# Patient Record
Sex: Male | Born: 1995 | Race: Black or African American | Hispanic: No | Smoking: Never smoker
Health system: Southern US, Community
[De-identification: ages and names within clinical notes are randomized; demographics above are authoritative.]

## PROBLEM LIST (undated history)

## (undated) DIAGNOSIS — J45909 Unspecified asthma, uncomplicated: Secondary | ICD-10-CM

## (undated) HISTORY — DX: Unspecified asthma, uncomplicated: J45.909

---

## 2019-10-08 ENCOUNTER — Emergency Department (HOSPITAL_COMMUNITY)
Admission: EM | Admit: 2019-10-08 | Discharge: 2019-10-09 | Disposition: A | Discharge status: Home / Self Care | Payer: Self-pay | Attending: Emergency Medicine | Admitting: Emergency Medicine

## 2019-10-08 ENCOUNTER — Other Ambulatory Visit: Payer: Self-pay

## 2019-10-08 DIAGNOSIS — R112 Nausea with vomiting, unspecified: Secondary | ICD-10-CM | POA: Insufficient documentation

## 2019-10-08 DIAGNOSIS — M6281 Muscle weakness (generalized): Secondary | ICD-10-CM | POA: Insufficient documentation

## 2019-10-08 DIAGNOSIS — Z5321 Procedure and treatment not carried out due to patient leaving prior to being seen by health care provider: Secondary | ICD-10-CM | POA: Insufficient documentation

## 2019-10-09 ENCOUNTER — Encounter (HOSPITAL_COMMUNITY): Payer: Self-pay | Admitting: Emergency Medicine

## 2019-10-09 ENCOUNTER — Other Ambulatory Visit: Payer: Self-pay

## 2019-10-09 ENCOUNTER — Emergency Department (HOSPITAL_COMMUNITY): Payer: Self-pay

## 2019-10-09 MED ORDER — ALBUTEROL SULFATE HFA 108 (90 BASE) MCG/ACT IN AERS
2.0000 | INHALATION_SPRAY | Freq: Once | RESPIRATORY_TRACT | Status: AC
  Administered 2019-10-09: 2 via RESPIRATORY_TRACT
  Filled 2019-10-09: qty 6.7

## 2019-10-09 NOTE — ED Notes (Signed)
Pt called for VS x3. No answer

## 2019-10-09 NOTE — ED Triage Notes (Signed)
Pt to ED with c/o nausea and vomiting and generalized weakness  Onset 3 days ago   St's unable to keep anything down.  Pt also st's he has asthma and is out of his inhaler.

## 2021-07-23 IMAGING — CR DG CHEST 2V
2 series · 2 of 2 positions shown · non-contrast
Comparison: None.

CLINICAL DATA: 24-year-old male with asthma.

EXAM:
CHEST - 2 VIEW

[chest pa]
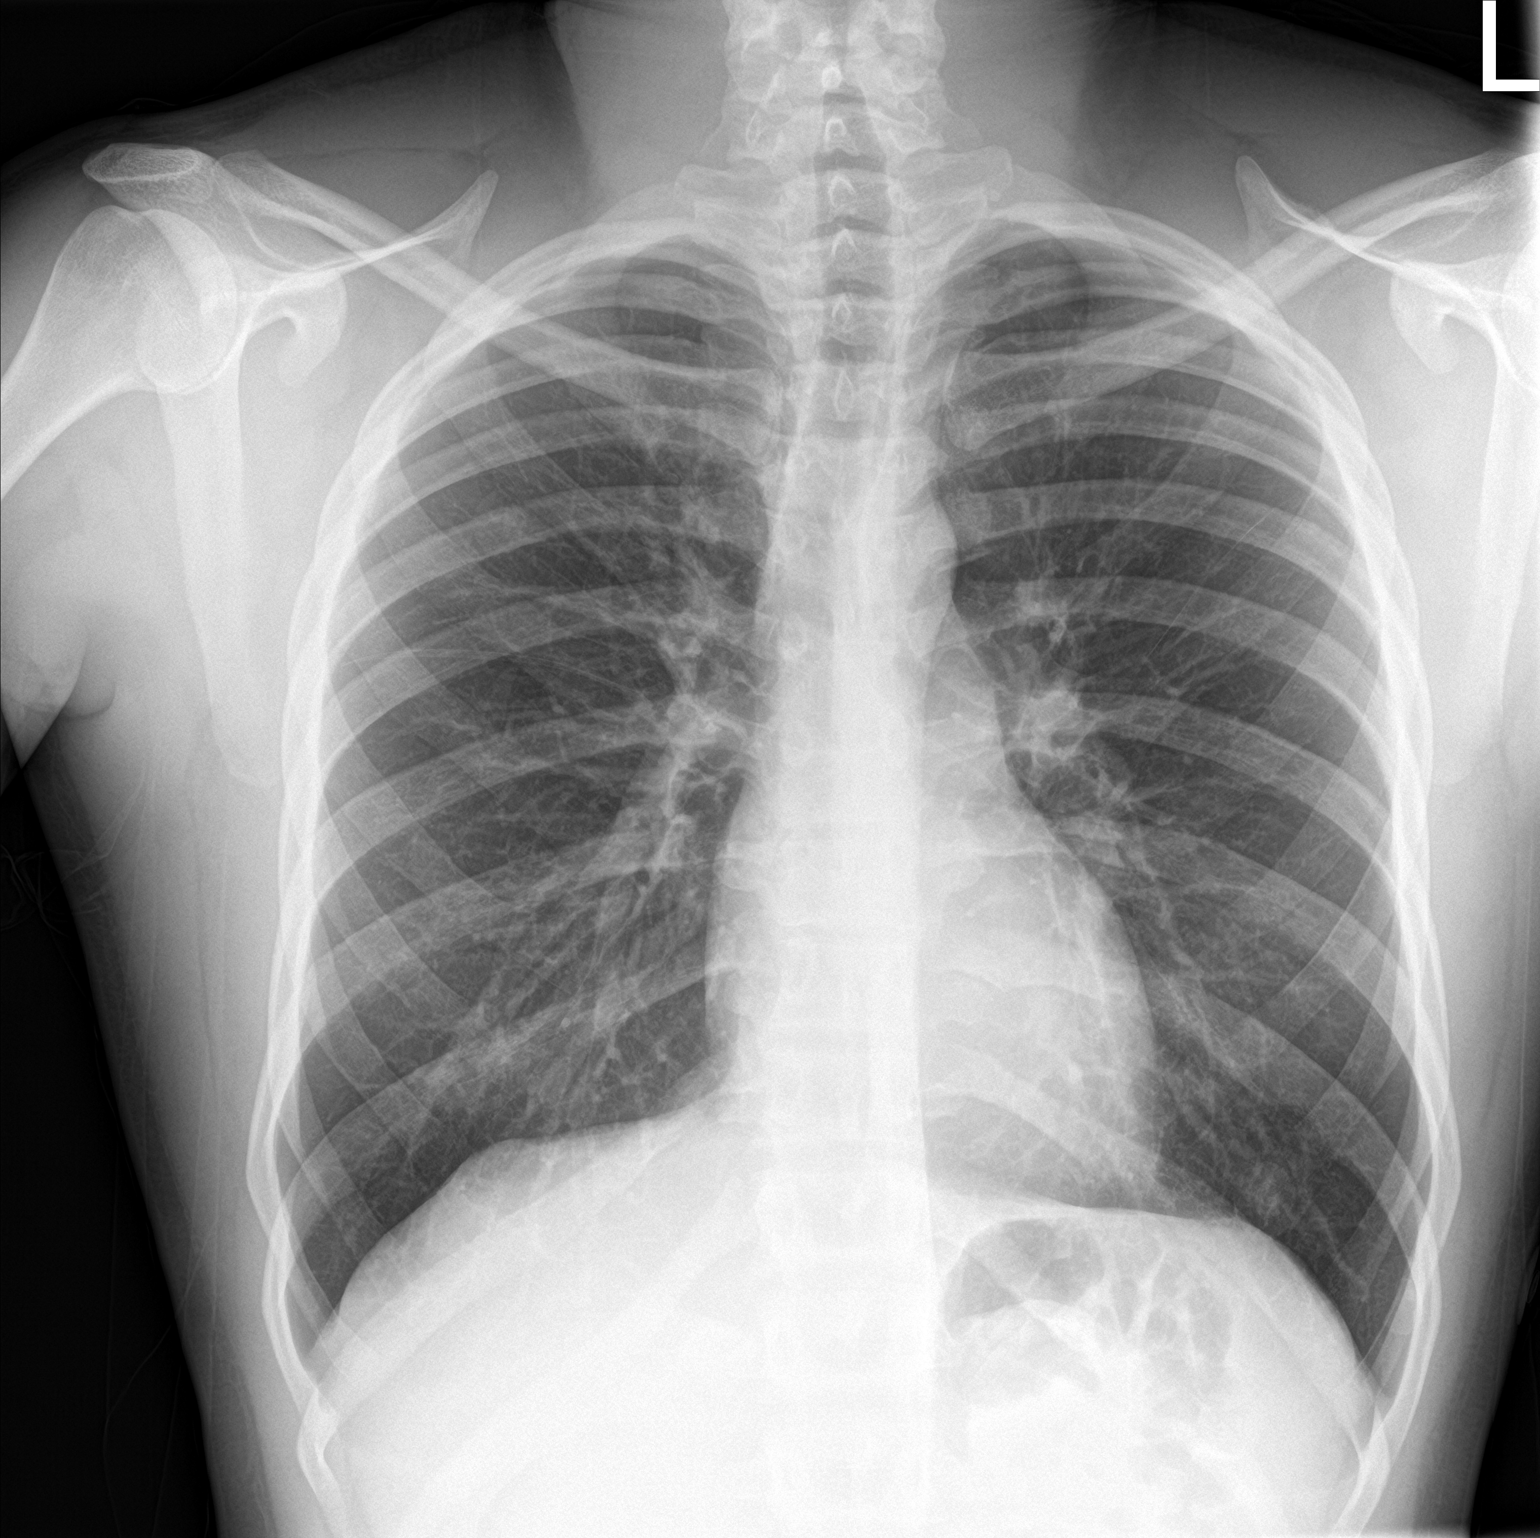

[chest lat]
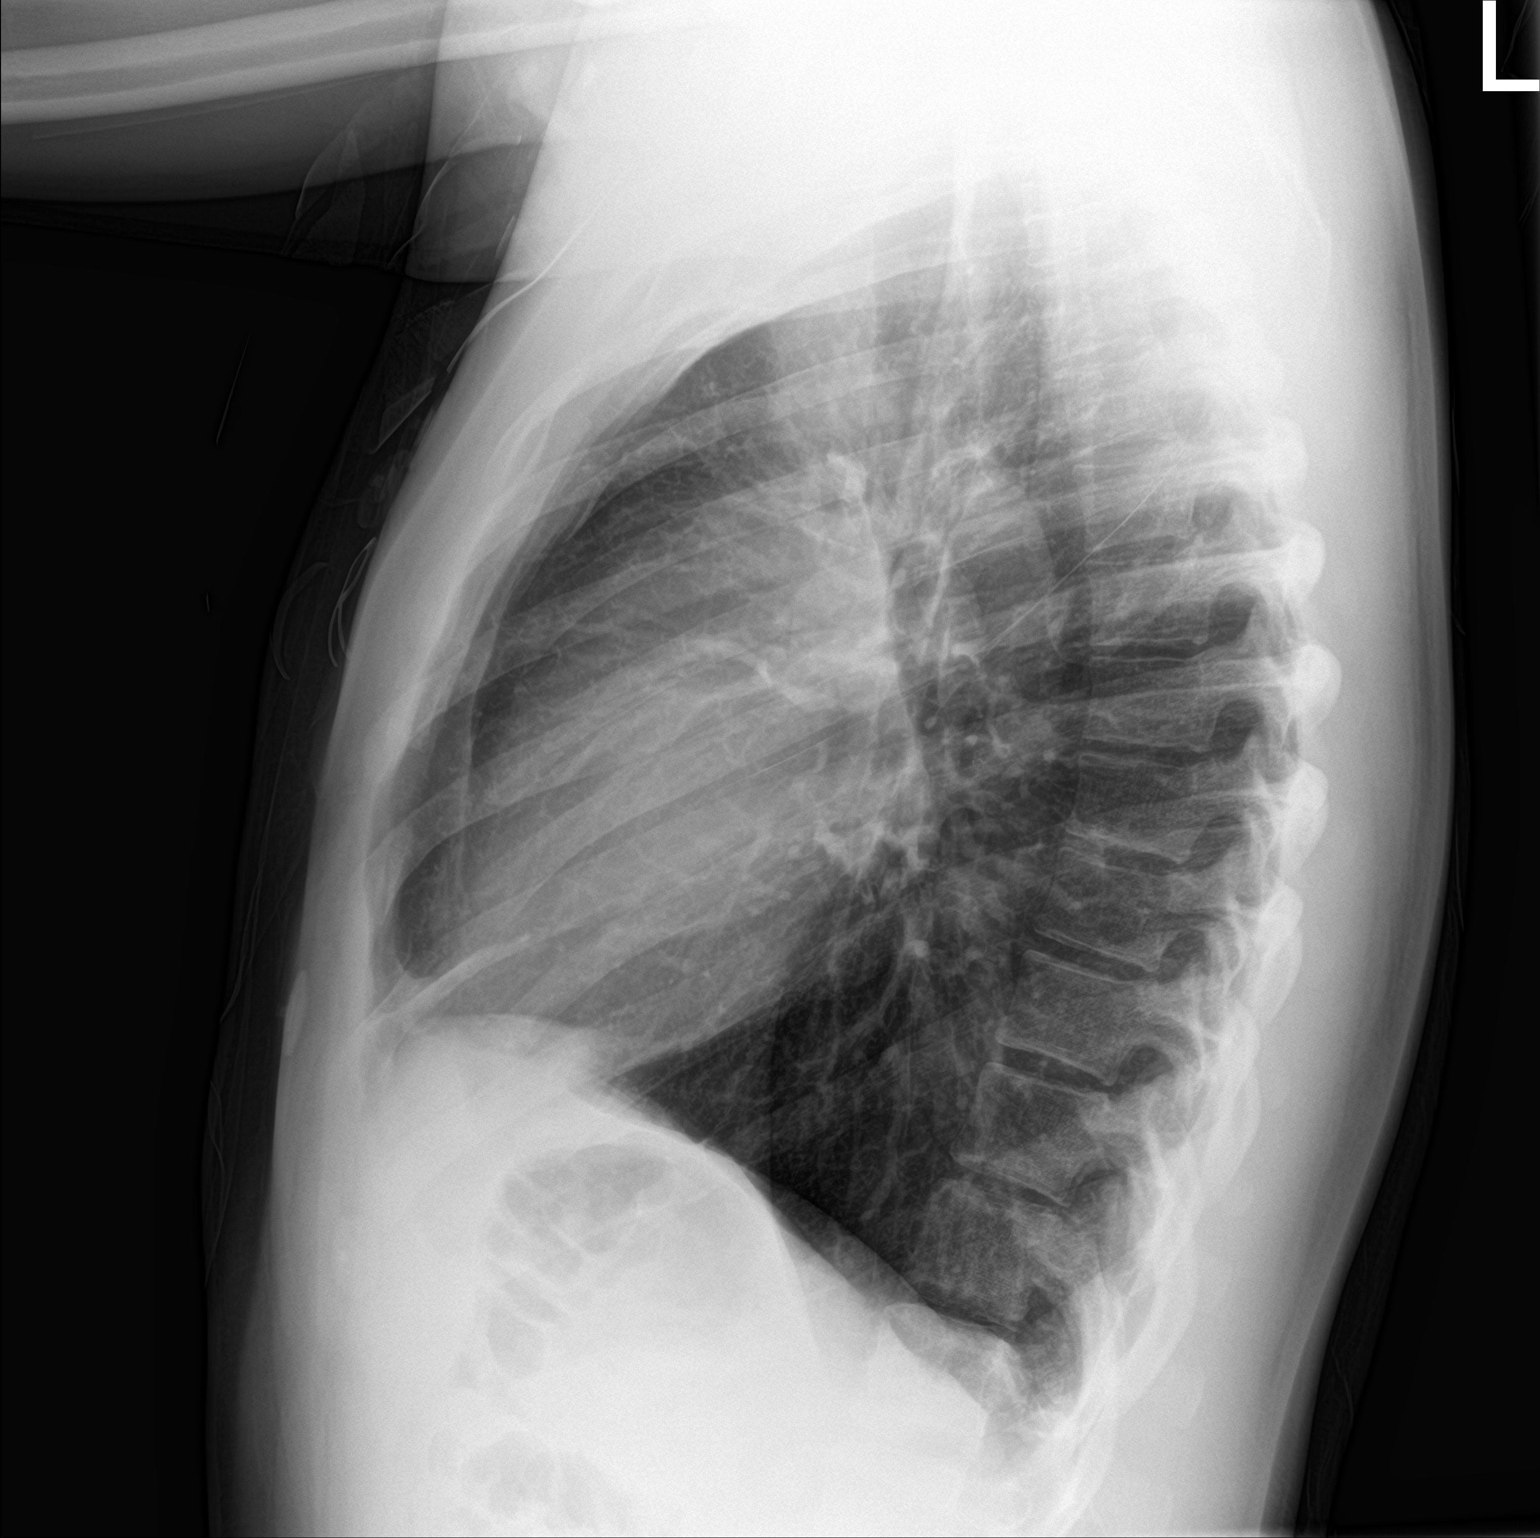

[2 of 2 positions shown; findings below may reference images not displayed]

FINDINGS: The heart size and mediastinal contours are within normal limits.
Both lungs are clear. The visualized skeletal structures are
unremarkable.
IMPRESSION: No active cardiopulmonary disease.
